# Patient Record
Sex: Female | Born: 1945 | Race: Black or African American | Hispanic: Yes | Marital: Single | Smoking: Current every day smoker
Health system: Southern US, Community
[De-identification: ages and names within clinical notes are randomized; demographics above are authoritative.]

## PROBLEM LIST (undated history)

## (undated) DIAGNOSIS — I10 Essential (primary) hypertension: Secondary | ICD-10-CM

## (undated) HISTORY — PX: BACK SURGERY: SHX140

## (undated) HISTORY — PX: COLONOSCOPY: SHX174

---

## 2017-07-12 ENCOUNTER — Other Ambulatory Visit (HOSPITAL_COMMUNITY): Payer: Self-pay | Admitting: Internal Medicine

## 2017-07-12 DIAGNOSIS — Z1231 Encounter for screening mammogram for malignant neoplasm of breast: Secondary | ICD-10-CM

## 2017-07-27 ENCOUNTER — Encounter (HOSPITAL_COMMUNITY): Payer: Self-pay | Admitting: Radiology

## 2017-07-27 ENCOUNTER — Ambulatory Visit (HOSPITAL_COMMUNITY)
Admission: RE | Admit: 2017-07-27 | Discharge: 2017-07-27 | Disposition: A | Discharge status: Home / Self Care | Payer: Federal, State, Local not specified - PPO | Source: Ambulatory Visit | Attending: Internal Medicine | Admitting: Internal Medicine

## 2017-07-27 DIAGNOSIS — Z1231 Encounter for screening mammogram for malignant neoplasm of breast: Secondary | ICD-10-CM | POA: Insufficient documentation

## 2017-08-24 ENCOUNTER — Ambulatory Visit: Payer: Federal, State, Local not specified - PPO

## 2017-09-07 ENCOUNTER — Ambulatory Visit: Payer: Federal, State, Local not specified - PPO

## 2017-10-17 ENCOUNTER — Ambulatory Visit: Payer: Federal, State, Local not specified - PPO

## 2017-11-08 ENCOUNTER — Ambulatory Visit (INDEPENDENT_AMBULATORY_CARE_PROVIDER_SITE_OTHER): Payer: Self-pay

## 2017-11-08 DIAGNOSIS — Z1211 Encounter for screening for malignant neoplasm of colon: Secondary | ICD-10-CM

## 2017-11-08 MED ORDER — NA SULFATE-K SULFATE-MG SULF 17.5-3.13-1.6 GM/177ML PO SOLN: 1.0000 | ORAL | 0 refills | Status: DC

## 2017-11-08 NOTE — Patient Instructions (Addendum)
Darlene Coleman  03-19-1945 MRN: [id number]     Procedure Date: 03/06/18 Time to register: 8:30am Place to register: [facility] Procedure Time: 9:30am Scheduled provider: Barney Drain, MD    PREPARATION FOR COLONOSCOPY WITH SUPREP BOWEL PREP KIT  Note: Suprep Bowel Prep Kit is a split-dose (2day) regimen. Consumption of BOTH 6-ounce bottles is required for a complete prep.  Please notify us immediately if you are diabetic, take iron supplements, or if you are on Coumadin or any other blood thinners                                                                                                                                                 1 DAY BEFORE PROCEDURE:  DATE: 03/05/18   DAY: Sunday  clear liquids the entire day - NO SOLID FOOD.    At 6:00pm: Complete steps 1 through 4 below, using ONE (1) 6-ounce bottle, before going to bed. Step 1:  Pour ONE (1) 6-ounce bottle of SUPREP liquid into the mixing container.  Step 2:  Add cool drinking water to the 16 ounce line on the container and mix.  Note: Dilute the solution concentrate as directed prior to use. Step 3:  DRINK ALL the liquid in the container. Step 4:  You MUST drink an additional two (2) or more 16 ounce containers of water over the next one (1) hour.   Continue clear liquids.  DAY OF PROCEDURE:   DATE: 03/06/18   DAY: Monday If you take medications for your heart, blood pressure, or breathing, you may take these medications.   5 hours before your procedure at :4:30am Step 1:  Pour ONE (1) 6-ounce bottle of SUPREP liquid into the mixing container.  Step 2:  Add cool drinking water to the 16 ounce line on the container and mix.  Note: Dilute the solution concentrate as directed prior to use. Step 3:  DRINK ALL the liquid in the container. Step 4:  You MUST drink an additional two (2) or more 16 ounce containers of water over the next one (1) hour. You MUST complete the final glass of water at least 3  hours before your colonoscopy.   Nothing by mouth past 6:30am  You may take your morning medications with sip of water unless we have instructed otherwise.    Please see below for Dietary Information.  CLEAR LIQUIDS INCLUDE:  Water Jello (NOT red in color)   Ice Popsicles (NOT red in color)   Tea (sugar ok, no milk/cream) Powdered fruit flavored drinks  Coffee (sugar ok, no milk/cream) Gatorade/ Lemonade/ Kool-Aid  (NOT red in color)   Juice: apple, white grape, white cranberry Soft drinks  Clear bullion, consomme, broth (fat free beef/chicken/vegetable)  Carbonated beverages (any kind)  Strained chicken noodle soup Hard Candy   Remember: Clear liquids are liquids that will  allow you to see your fingers on the other side of a clear glass. Be sure liquids are NOT red in color, and not cloudy, but CLEAR.  DO NOT EAT OR DRINK ANY OF THE FOLLOWING:  Dairy products of any kind   Cranberry juice Tomato juice / V8 juice   Grapefruit juice Orange juice     Red grape juice  Do not eat any solid foods, including such foods as: cereal, oatmeal, yogurt, fruits, vegetables, creamed soups, eggs, bread, crackers, pureed foods in a blender, etc.   HELPFUL HINTS FOR DRINKING PREP SOLUTION:   Make sure prep is extremely cold. Mix and refrigerate the the morning of the prep. You may also put in the freezer.   You may try mixing some Crystal Light or Country Time Lemonade if you prefer. Mix in small amounts; add more if necessary.  Try drinking through a straw  Rinse mouth with water or a mouthwash between glasses, to remove after-taste.  Try sipping on a cold beverage /ice/ popsicles between glasses of prep.  Place a piece of sugar-free hard candy in mouth between glasses.  If you become nauseated, try consuming smaller amounts, or stretch out the time between glasses. Stop for 30-60 minutes, then slowly start back drinking.     OTHER INSTRUCTIONS  You will need a responsible adult at  least 71 years of age to accompany you and drive you home. This person must remain in the waiting room during your procedure. The hospital will cancel your procedure if you do not have a responsible adult with you.   1. Wear loose fitting clothing that is easily removed. 2. Leave jewelry and other valuables at home.  3. Remove all body piercing jewelry and leave at home. 4. Total time from sign-in until discharge is approximately 2-3 hours. 5. You should go home directly after your procedure and rest. You can resume normal activities the day after your procedure. 6. The day of your procedure you should not:  Drive  Make legal decisions  Operate machinery  Drink alcohol  Return to work   You may call the office (Dept: [phone]) before 5:00pm, or page the doctor on call [phone]) after 5:00pm, for further instructions, if necessary.   Insurance Information YOU WILL NEED TO CHECK WITH YOUR INSURANCE COMPANY FOR THE BENEFITS OF COVERAGE YOU HAVE FOR THIS PROCEDURE.  UNFORTUNATELY, NOT ALL INSURANCE COMPANIES HAVE BENEFITS TO COVER ALL OR PART OF THESE TYPES OF PROCEDURES.  IT IS YOUR RESPONSIBILITY TO CHECK YOUR BENEFITS, HOWEVER, WE WILL BE GLAD TO ASSIST YOU WITH ANY CODES YOUR INSURANCE COMPANY MAY NEED.    PLEASE NOTE THAT MOST INSURANCE COMPANIES WILL NOT COVER A SCREENING COLONOSCOPY FOR PEOPLE UNDER THE AGE OF 50  IF YOU HAVE BCBS INSURANCE, YOU MAY HAVE BENEFITS FOR A SCREENING COLONOSCOPY BUT IF POLYPS ARE FOUND THE DIAGNOSIS WILL CHANGE AND THEN YOU MAY HAVE A DEDUCTIBLE THAT WILL NEED TO BE MET. SO PLEASE MAKE SURE YOU CHECK YOUR BENEFITS FOR A SCREENING COLONOSCOPY AS WELL AS A DIAGNOSTIC COLONOSCOPY.

## 2017-11-08 NOTE — Progress Notes (Signed)
Gastroenterology Pre-Procedure Review  Request Date:11/08/17 Requesting Physician: Dr.Fanta- previous tcs 7-8 years ago at [facility] in [place]- pt reports 1 polyp unsure of path. Manuela Schwartz is going to try to get records.   PATIENT REVIEW QUESTIONS: The patient responded to the following health history questions as indicated:    1. Diabetes Melitis: no 2. Joint replacements in the past 12 months: no 3. Major health problems in the past 3 months: no 4. Has an artificial valve or MVP: no 5. Has a defibrillator: no 6. Has been advised in past to take antibiotics in advance of a procedure like teeth cleaning: no 7. Family history of colon cancer: no  8. Alcohol Use: yes (occasionally ) 9. History of sleep apnea: no  10. History of coronary artery or other vascular stents placed within the last 12 months: no 11. History of any prior anesthesia complications: no   MEDICATIONS & ALLERGIES:    Patient reports the following regarding taking any blood thinners:   Plavix? no Aspirin? no Coumadin? no Brilinta? no Xarelto? no Eliquis? no Pradaxa? no Savaysa? no Effient? no  Patient confirms/reports the following medications:  Current Outpatient Medications  Medication Sig Dispense Refill  . amLODipine-olmesartan (AZOR) 5-40 MG tablet Take 1 tablet by mouth daily.    Marland Kitchen ibuprofen (ADVIL,MOTRIN) 200 MG tablet Take 200 mg by mouth every 6 (six) hours as needed.    . Multiple Vitamin (MULTIVITAMIN) tablet Take 1 tablet by mouth daily.     No current facility-administered medications for this visit.     Patient confirms/reports the following allergies:  No Known Allergies  No orders of the defined types were placed in this encounter.   AUTHORIZATION INFORMATION Primary Insurance: Roseanna Rainbow  ID #: [id number] Pre-Cert / Josem Kaufmann required:  Pre-Cert / Josem Kaufmann #:   SCHEDULE INFORMATION: Procedure has been scheduled as follows:  Date: 01/13/18, Time: 10:30 Location: [facility]  Dr.Fields  This Gastroenterology Pre-Precedure Review Form is being routed to the following provider(s): Walden Field NP

## 2017-11-11 NOTE — Progress Notes (Signed)
Ok to schedule.

## 2017-12-29 NOTE — Progress Notes (Signed)
Pt called- her daughter who lives out of town has to have chemo the same week as the pts tcs. Pt wanted to reschedule. We have changed her tcs to 03/06/18 and I have mailed her new instructions with correct dates and times. Endo is aware.

## 2018-03-06 ENCOUNTER — Encounter (HOSPITAL_COMMUNITY): Payer: Self-pay | Admitting: *Deleted

## 2018-03-06 ENCOUNTER — Encounter (HOSPITAL_COMMUNITY)
Admission: RE | Disposition: A | Discharge status: Home / Self Care | Payer: Self-pay | Source: Ambulatory Visit | Attending: Gastroenterology

## 2018-03-06 ENCOUNTER — Ambulatory Visit (HOSPITAL_COMMUNITY)
Admission: RE | Admit: 2018-03-06 | Discharge: 2018-03-06 | Disposition: A | Discharge status: Home / Self Care | Payer: Federal, State, Local not specified - PPO | Source: Ambulatory Visit | Attending: Gastroenterology | Admitting: Gastroenterology

## 2018-03-06 ENCOUNTER — Other Ambulatory Visit: Payer: Self-pay

## 2018-03-06 DIAGNOSIS — Z79899 Other long term (current) drug therapy: Secondary | ICD-10-CM | POA: Diagnosis not present

## 2018-03-06 DIAGNOSIS — Q438 Other specified congenital malformations of intestine: Secondary | ICD-10-CM | POA: Insufficient documentation

## 2018-03-06 DIAGNOSIS — K5730 Diverticulosis of large intestine without perforation or abscess without bleeding: Secondary | ICD-10-CM | POA: Diagnosis not present

## 2018-03-06 DIAGNOSIS — D125 Benign neoplasm of sigmoid colon: Secondary | ICD-10-CM | POA: Insufficient documentation

## 2018-03-06 DIAGNOSIS — K621 Rectal polyp: Secondary | ICD-10-CM | POA: Diagnosis not present

## 2018-03-06 DIAGNOSIS — F17210 Nicotine dependence, cigarettes, uncomplicated: Secondary | ICD-10-CM | POA: Diagnosis not present

## 2018-03-06 DIAGNOSIS — K648 Other hemorrhoids: Secondary | ICD-10-CM | POA: Insufficient documentation

## 2018-03-06 DIAGNOSIS — D122 Benign neoplasm of ascending colon: Secondary | ICD-10-CM

## 2018-03-06 DIAGNOSIS — Z1211 Encounter for screening for malignant neoplasm of colon: Secondary | ICD-10-CM

## 2018-03-06 DIAGNOSIS — D123 Benign neoplasm of transverse colon: Secondary | ICD-10-CM | POA: Insufficient documentation

## 2018-03-06 DIAGNOSIS — I10 Essential (primary) hypertension: Secondary | ICD-10-CM | POA: Insufficient documentation

## 2018-03-06 HISTORY — PX: POLYPECTOMY: SHX5525

## 2018-03-06 HISTORY — PX: COLONOSCOPY: SHX5424

## 2018-03-06 HISTORY — DX: Essential (primary) hypertension: I10

## 2018-03-06 SURGERY — COLONOSCOPY
Anesthesia: Moderate Sedation

## 2018-03-06 MED ORDER — MEPERIDINE HCL 100 MG/ML IJ SOLN
INTRAMUSCULAR | Status: AC
  Filled 2018-03-06: qty 2

## 2018-03-06 MED ORDER — MIDAZOLAM HCL 5 MG/5ML IJ SOLN
INTRAMUSCULAR | Status: DC | PRN
  Administered 2018-03-06: 2 mg via INTRAVENOUS
  Administered 2018-03-06: 1 mg via INTRAVENOUS
  Administered 2018-03-06: 2 mg via INTRAVENOUS

## 2018-03-06 MED ORDER — MIDAZOLAM HCL 5 MG/5ML IJ SOLN
INTRAMUSCULAR | Status: AC
  Filled 2018-03-06: qty 10

## 2018-03-06 MED ORDER — STERILE WATER FOR IRRIGATION IR SOLN
Status: DC | PRN
  Administered 2018-03-06: 10:00:00

## 2018-03-06 MED ORDER — MEPERIDINE HCL 100 MG/ML IJ SOLN
INTRAMUSCULAR | Status: DC | PRN
  Administered 2018-03-06 (×3): 25 mg

## 2018-03-06 MED ORDER — SODIUM CHLORIDE 0.9 % IV SOLN
INTRAVENOUS | Status: DC
  Administered 2018-03-06: 09:00:00 via INTRAVENOUS

## 2018-03-06 NOTE — H&P (Addendum)
Primary Care Physician:  Rosita Fire, MD Primary Gastroenterologist:  Dr. Oneida Alar  Pre-Procedure History & Physical: HPI:  Darlene Coleman is a 73 y.o. female here for  [place]..  Past Medical History:  Diagnosis Date  . Hypertension     Past Surgical History:  Procedure Laterality Date  . BACK SURGERY    . COLONOSCOPY      Prior to Admission medications   Medication Sig Start Date End Date Taking? Authorizing Provider  amLODipine-olmesartan (AZOR) 5-40 MG tablet Take 1 tablet by mouth daily.   Yes [provider]  hydrochlorothiazide (MICROZIDE) 12.5 MG capsule Take 12.5 mg by mouth daily.   Yes [provider]  Multiple Vitamin (MULTIVITAMIN) tablet Take 1 tablet by mouth daily.   Yes [provider]  Na Sulfate-K Sulfate-Mg Sulf (SUPREP BOWEL PREP KIT) 17.5-3.13-1.6 GM/177ML SOLN Take 1 kit by mouth as directed. 11/08/17  Yes Carlis Stable, NP  naproxen sodium (ALEVE) 220 MG tablet Take 440 mg by mouth daily as needed (for back pain).    [provider]    Allergies as of 11/08/2017  . (No Known Allergies)    Family History  Problem Relation Age of Onset  . Colon cancer Neg Hx     Social History   Socioeconomic History  . Marital status: Single    Spouse name: Not on file  . Number of children: Not on file  . Years of education: Not on file  . Highest education level: Not on file  Occupational History  . Not on file  Social Needs  . Financial resource strain: Not on file  . Food insecurity:    Worry: Not on file    Inability: Not on file  . Transportation needs:    Medical: Not on file    Non-medical: Not on file  Tobacco Use  . Smoking status: Current Every Day Smoker    Packs/day: 0.50    Types: Cigarettes  . Smokeless tobacco: Never Used  Substance and Sexual Activity  . Alcohol use: Yes    Comment: 1/2 pint per week  . Drug use: Not Currently  . Sexual activity: Not on file  Lifestyle  .  Physical activity:    Days per week: Not on file    Minutes per session: Not on file  . Stress: Not on file  Relationships  . Social connections:    Talks on phone: Not on file    Gets together: Not on file    Attends religious service: Not on file    Active member of club or organization: Not on file    Attends meetings of clubs or organizations: Not on file    Relationship status: Not on file  . Intimate partner violence:    Fear of current or ex partner: Not on file    Emotionally abused: Not on file    Physically abused: Not on file    Forced sexual activity: Not on file  Other Topics Concern  . Not on file  Social History Narrative  . Not on file    Review of Systems: See HPI, otherwise negative ROS   Physical Exam: BP 140/67   Pulse 77   Temp 98.9 F (37.2 C)   Resp 15   Ht 5' 8.5" (1.74 m)   Wt 107 kg   SpO2 98%   BMI 35.36 kg/m  General:   Alert,  pleasant and cooperative in NAD Head:  Normocephalic and atraumatic. Neck:  Supple; Lungs:  Clear throughout to auscultation.    Heart:  Regular rate and rhythm. Abdomen:  Soft, nontender and nondistended. Normal bowel sounds, without guarding, and without rebound.   Neurologic:  Alert and  oriented x4;  grossly normal neurologically.  Impression/Plan:     COLON CANCER SCREENING.  PLAN: 1. TCS TODAY. DISCUSSED PROCEDURE, BENEFITS, & RISKS: < 1% chance of medication reaction, bleeding, perforation, or rupture of spleen/liver.

## 2018-03-06 NOTE — Op Note (Signed)
[facility] Patient Name: Darlene Coleman Procedure Date: 03/06/2018 8:19 AM MRN: [id number] Date of Birth: 1945-04-03 Attending MD: Barney Drain MD, MD CSN: [id number] Age: 73 Admit Type: Outpatient Procedure:                Colonoscopy WITH COLD SNARE POLYPECTOMY Indications:              Screening for colorectal malignant neoplasm Providers:                Barney Drain MD, MD, Janeece Riggers, RN, Tammy Vaught,                            RN, Gerome Sam, RN Referring MD:             Rosita Fire MD, MD Medicines:                Meperidine 75 mg IV, Midazolam 5 mg IV Complications:            No immediate complications. Estimated Blood Loss:     Estimated blood loss was minimal. Procedure:                Pre-Anesthesia Assessment:                           - Prior to the procedure, a History and Physical                            was performed, and patient medications and                            allergies were reviewed. The patient's tolerance of                            previous anesthesia was also reviewed. The risks                            and benefits of the procedure and the sedation                            options and risks were discussed with the patient.                            All questions were answered, and informed consent                            was obtained. Prior Anticoagulants: The patient has                            taken no previous anticoagulant or antiplatelet                            agents. ASA Grade Assessment: II - A patient with                            mild systemic disease. After reviewing the risks  and benefits, the patient was deemed in                            satisfactory condition to undergo the procedure.                            After obtaining informed consent, the colonoscope                            was passed under direct vision. Throughout the   procedure, the patient's blood pressure, pulse, and                            oxygen saturations were monitored continuously. The                            [id number]-[id number] ([id number]) scope was introduced through                            the anus and advanced to the the cecum, identified                            by appendiceal orifice and ileocecal valve. The                            colonoscopy was somewhat difficult due to a                            tortuous colon. Successful completion of the                            procedure was aided by straightening and shortening                            the scope to obtain bowel loop reduction and                            COLOWRAP. The patient tolerated the procedure well.                            The quality of the bowel preparation was good. The                            ileocecal valve, appendiceal orifice, and rectum                            were photographed. Scope In: 9:52:49 AM Scope Out: 10:15:51 AM Scope Withdrawal Time: 0 hours 21 minutes 15 seconds  Total Procedure Duration: 0 hours 23 minutes 2 seconds  Findings:      Seven sessile polyps were found in the rectum(3), sigmoid colon,       transverse colon and ascending colon(2). The polyps were 3 to 7 mm in       size. These polyps were removed with a cold snare. Resection and  retrieval were complete.      Multiple small and large-mouthed diverticula were found in the entire       colon.      Internal hemorrhoids were found. The hemorrhoids were small.      The recto-sigmoid colon and sigmoid colon were mildly redundant. Impression:               - Seven 3 to 7 mm polyps in the rectum, in the                            sigmoid colon, in the transverse colon and in the                            ascending colon, removed with a cold snare.                            Resected and retrieved.                           - MODERATE Diverticulosis in the entire examined                             colon.                           - Internal hemorrhoids.                           - Redundant colon. Moderate Sedation:      Moderate (conscious) sedation was administered by the endoscopy nurse       and supervised by the endoscopist. The following parameters were       monitored: oxygen saturation, heart rate, blood pressure, and response       to care. Total physician intraservice time was 38 minutes. Recommendation:           - Patient has a contact number available for                            emergencies. The signs and symptoms of potential                            delayed complications were discussed with the                            patient. Return to normal activities tomorrow.                            Written discharge instructions were provided to the                            patient.                           - High fiber diet. LOSE WEIGHT TO BMI < 30                           -  Continue present medications.                           - Await pathology results.                           - Repeat colonoscopy in 3 years for surveillance. Procedure Code(s):        --- Professional ---                           [phone], Colonoscopy, flexible; with removal of                            tumor(s), polyp(s), or other lesion(s) by snare                            technique                           99153, Moderate sedation; each additional 15                            minutes intraservice time                           99153, Moderate sedation; each additional 15                            minutes intraservice time                           G0500, Moderate sedation services provided by the                            same physician or other qualified health care                            professional performing a gastrointestinal                            endoscopic service that sedation supports,                            requiring the presence of  an independent trained                            observer to assist in the monitoring of the                            patient's level of consciousness and physiological                            status; initial 15 minutes of intra-service time;                            patient age 63 years or older (additional time 89  be reported with [phone], as appropriate) Diagnosis Code(s):        --- Professional ---                           Z12.11, Encounter for screening for malignant                            neoplasm of colon                           K62.1, Rectal polyp                           D12.5, Benign neoplasm of sigmoid colon                           D12.3, Benign neoplasm of transverse colon (hepatic                            flexure or splenic flexure)                           D12.2, Benign neoplasm of ascending colon                           K64.8, Other hemorrhoids                           K57.30, Diverticulosis of large intestine without                            perforation or abscess without bleeding                           Q43.8, Other specified congenital malformations of                            intestine CPT copyright 2018 [employer] Association. All rights reserved. The codes documented in this report are preliminary and upon coder review may  be revised to meet current compliance requirements. Barney Drain, MD Barney Drain MD, MD 03/06/2018 10:49:23 AM This report has been signed electronically. Number of Addenda: 0

## 2018-03-06 NOTE — Discharge Instructions (Signed)
You have small internal hemorrhoids and diverticulosis IN YOUR LEFT AND RIGHT COLON. YOU HAD SEVEN POLYPS REMOVED.    DRINK WATER TO KEEP YOUR URINE LIGHT YELLOW.  CONTINUE YOUR WEIGHT LOSS EFFORTS. YOUR BODY MASS INDEX IS OVER 30 WHICH MEANS YOU ARE OBESE. OBESITY IS ASSOCIATED WITH AN INCREASED FOR CIRRHOSIS AND ALL CANCERS, INCLUDING ESOPHAGEAL AND COLON CANCER. A WEIGHT OF 195 LBS OR LESS  WILL GET YOUR BODY MASS INDEX(BMI) UNDER 30.  FOLLOW A HIGH FIBER DIET. AVOID ITEMS THAT CAUSE BLOATING. See info below.  YOUR BIOPSY RESULTS WILL BE BACK IN 5 BUSINESS DAYS.  USE PREPARATION H FOUR TIMES  A DAY IF NEEDED TO RELIEVE RECTAL PAIN/PRESSURE/BLEEDING.  Next colonoscopy in 3 years.  Colonoscopy Care After Read the instructions outlined below and refer to this sheet in the next week. These discharge instructions provide you with general information on caring for yourself after you leave the hospital. While your treatment has been planned according to the most current medical practices available, unavoidable complications occasionally occur. If you have any problems or questions after discharge, call DR. Hakan Nudelman, [phone].  ACTIVITY  You may resume your regular activity, but move at a slower pace for the next 24 hours.   Take frequent rest periods for the next 24 hours.   Walking will help get rid of the air and reduce the bloated feeling in your belly (abdomen).   No driving for 24 hours (because of the medicine (anesthesia) used during the test).   You may shower.   Do not sign any important legal documents or operate any machinery for 24 hours (because of the anesthesia used during the test).    NUTRITION  Drink plenty of fluids.   You may resume your normal diet as instructed by your doctor.   Begin with a light meal and progress to your normal diet. Heavy or fried foods are harder to digest and may make you feel sick to your stomach (nauseated).   Avoid alcoholic  beverages for 24 hours or as instructed.    MEDICATIONS  You may resume your normal medications.   WHAT YOU CAN EXPECT TODAY  Some feelings of bloating in the abdomen.   Passage of more gas than usual.   Spotting of blood in your stool or on the toilet paper  .  IF YOU HAD POLYPS REMOVED DURING THE COLONOSCOPY:  Eat a soft diet IF YOU HAVE NAUSEA, BLOATING, ABDOMINAL PAIN, OR VOMITING.    FINDING OUT THE RESULTS OF YOUR TEST Not all test results are available during your visit. DR. Oneida Alar WILL CALL YOU WITHIN 14 DAYS OF YOUR PROCEDUE WITH YOUR RESULTS. Do not assume everything is normal if you have not heard from DR. Gregrey Bloyd, CALL HER OFFICE AT [phone].  SEEK IMMEDIATE MEDICAL ATTENTION AND CALL THE OFFICE: [phone] IF:  You have more than a spotting of blood in your stool.   Your belly is swollen (abdominal distention).   You are nauseated or vomiting.   You have a temperature over 101F.   You have abdominal pain or discomfort that is severe or gets worse throughout the day.  High-Fiber Diet A high-fiber diet changes your normal diet to include more whole grains, legumes, fruits, and vegetables. Changes in the diet involve replacing refined carbohydrates with unrefined foods. The calorie level of the diet is essentially unchanged. The Dietary Reference Intake (recommended amount) for adult males is 38 grams per day. For adult females, it is 25 grams per day.  Pregnant and lactating women should consume 28 grams of fiber per day. Fiber is the intact part of a plant that is not broken down during digestion. Functional fiber is fiber that has been isolated from the plant to provide a beneficial effect in the body.  PURPOSE  Increase stool bulk.   Ease and regulate bowel movements.   Lower cholesterol.   REDUCE RISK OF COLON CANCER  INDICATIONS THAT YOU NEED MORE FIBER  Constipation and hemorrhoids.   Uncomplicated diverticulosis (intestine condition) and  irritable bowel syndrome.   Weight management.   As a protective measure against hardening of the arteries (atherosclerosis), diabetes, and cancer.   GUIDELINES FOR INCREASING FIBER IN THE DIET  Start adding fiber to the diet slowly. A gradual increase of about 5 more grams (2 slices of whole-wheat bread, 2 servings of most fruits or vegetables, or 1 bowl of high-fiber cereal) per day is best. Too rapid an increase in fiber may result in constipation, flatulence, and bloating.   Drink enough water and fluids to keep your urine clear or pale yellow. Water, juice, or caffeine-free drinks are recommended. Not drinking enough fluid may cause constipation.   Eat a variety of high-fiber foods rather than one type of fiber.   Try to increase your intake of fiber through using high-fiber foods rather than fiber pills or supplements that contain small amounts of fiber.   The goal is to change the types of food eaten. Do not supplement your present diet with high-fiber foods, but replace foods in your present diet.   INCLUDE A VARIETY OF FIBER SOURCES  Replace refined and processed grains with whole grains, canned fruits with fresh fruits, and incorporate other fiber sources. White rice, white breads, and most bakery goods contain little or no fiber.   Brown whole-grain rice, buckwheat oats, and many fruits and vegetables are all good sources of fiber. These include: broccoli, Brussels sprouts, cabbage, cauliflower, beets, sweet potatoes, white potatoes (skin on), carrots, tomatoes, eggplant, squash, berries, fresh fruits, and dried fruits.   Cereals appear to be the richest source of fiber. Cereal fiber is found in whole grains and bran. Bran is the fiber-rich outer coat of cereal grain, which is largely removed in refining. In whole-grain cereals, the bran remains. In breakfast cereals, the largest amount of fiber is found in those with "bran" in their names. The fiber content is sometimes indicated  on the label.   You may need to include additional fruits and vegetables each day.   In baking, for 1 cup white flour, you may use the following substitutions:   1 cup whole-wheat flour minus 2 tablespoons.   1/2 cup white flour plus 1/2 cup whole-wheat flour.   Polyps, Colon  A polyp is extra tissue that grows inside your body. Colon polyps grow in the large intestine. The large intestine, also called the colon, is part of your digestive system. It is a long, hollow tube at the end of your digestive tract where your body makes and stores stool. Most polyps are not dangerous. They are benign. This means they are not cancerous. But over time, some types of polyps can turn into cancer. Polyps that are smaller than a pea are usually not harmful. But larger polyps could someday become or may already be cancerous. To be safe, doctors remove all polyps and test them.   PREVENTION There is not one sure way to prevent polyps. You might be able to lower your risk of getting them  if you:  Eat more fruits and vegetables and less fatty food.   Do not smoke.   Avoid alcohol.   Exercise every day.   Lose weight if you are overweight.   Eating more calcium and folate can also lower your risk of getting polyps. Some foods that are rich in calcium are milk, cheese, and broccoli. Some foods that are rich in folate are chickpeas, kidney beans, and spinach.    Diverticulosis Diverticulosis is a common condition that develops when small pouches (diverticula) form in the wall of the colon. The risk of diverticulosis increases with age. It happens more often in people who eat a low-fiber diet. Most individuals with diverticulosis have no symptoms. Those individuals with symptoms usually experience belly (abdominal) pain, constipation, or loose stools (diarrhea).  HOME CARE INSTRUCTIONS  Increase the amount of fiber in your diet as directed by your caregiver or dietician. This may reduce symptoms of  diverticulosis.   Drink at least 6 to 8 glasses of water each day to prevent constipation.   Try not to strain when you have a bowel movement.   Avoiding nuts and seeds to prevent complications is NOT NECESSARY.   FOODS HAVING HIGH FIBER CONTENT INCLUDE:  Fruits. Apple, peach, pear, tangerine, raisins, prunes.   Vegetables. Brussels sprouts, asparagus, broccoli, cabbage, carrot, cauliflower, romaine lettuce, spinach, summer squash, tomato, winter squash, zucchini.   Starchy Vegetables. Baked beans, kidney beans, lima beans, split peas, lentils, potatoes (with skin).   Grains. Whole wheat bread, brown rice, bran flake cereal, plain oatmeal, white rice, shredded wheat, bran muffins.   SEEK IMMEDIATE MEDICAL CARE IF:  You develop increasing pain or severe bloating.   You have an oral temperature above 101F.   You develop vomiting or bowel movements that are bloody or black.

## 2018-03-08 ENCOUNTER — Encounter (HOSPITAL_COMMUNITY): Payer: Self-pay | Admitting: Gastroenterology

## 2018-03-08 ENCOUNTER — Telehealth: Payer: Self-pay | Admitting: Gastroenterology

## 2018-03-08 NOTE — Telephone Encounter (Signed)
Pt is aware.  

## 2018-03-08 NOTE — Telephone Encounter (Signed)
Reminder in epic °

## 2018-03-08 NOTE — Telephone Encounter (Signed)
Please call pt. 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   DRINK WATER TO KEEP YOUR URINE LIGHT YELLOW. FOLLOW A HIGH FIBER DIET. AVOID ITEMS THAT CAUSE BLOATING & GAS.  Next colonoscopy in 3 years.

## 2018-06-23 ENCOUNTER — Other Ambulatory Visit (HOSPITAL_COMMUNITY): Payer: Self-pay | Admitting: Internal Medicine

## 2018-06-23 DIAGNOSIS — Z1231 Encounter for screening mammogram for malignant neoplasm of breast: Secondary | ICD-10-CM

## 2018-07-31 ENCOUNTER — Ambulatory Visit (HOSPITAL_COMMUNITY): Payer: Federal, State, Local not specified - PPO

## 2018-09-22 ENCOUNTER — Other Ambulatory Visit: Payer: Self-pay

## 2018-09-22 ENCOUNTER — Ambulatory Visit (HOSPITAL_COMMUNITY)
Admission: RE | Admit: 2018-09-22 | Discharge: 2018-09-22 | Disposition: A | Discharge status: Home / Self Care | Payer: Federal, State, Local not specified - PPO | Source: Ambulatory Visit | Attending: Internal Medicine | Admitting: Internal Medicine

## 2018-09-22 DIAGNOSIS — Z1231 Encounter for screening mammogram for malignant neoplasm of breast: Secondary | ICD-10-CM | POA: Diagnosis not present

## 2020-04-08 IMAGING — MG DIGITAL SCREENING BILATERAL MAMMOGRAM WITH TOMO AND CAD
8 series · 8 of 24 positions shown · non-contrast
Comparison: Previous exam(s).

CLINICAL DATA: Screening.

EXAM:
DIGITAL SCREENING BILATERAL MAMMOGRAM WITH TOMO AND CAD

[R CC synth-2D]
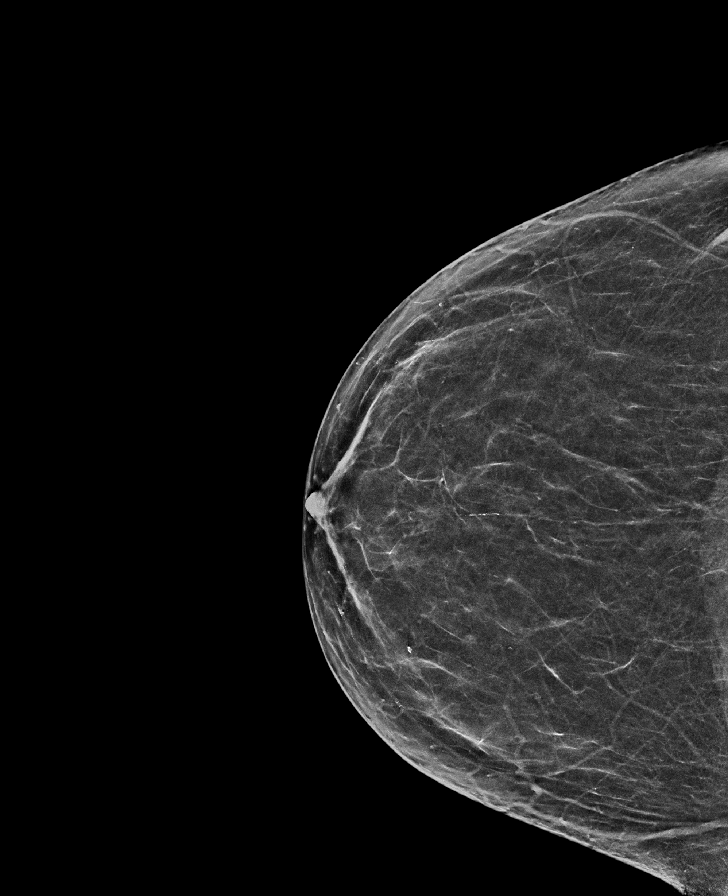

[R MLO synth-2D]
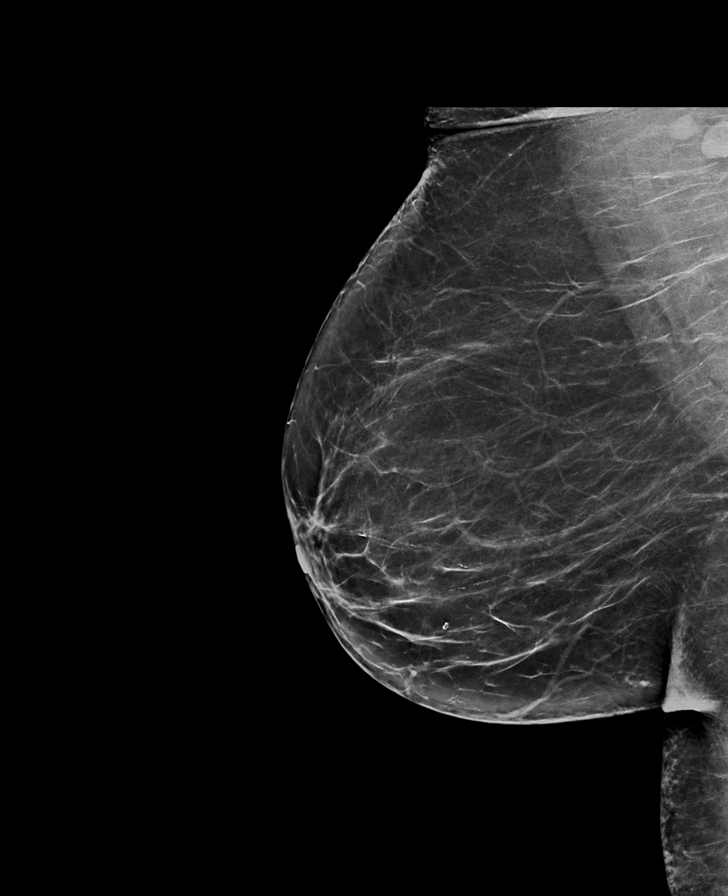

[L CC synth-2D]
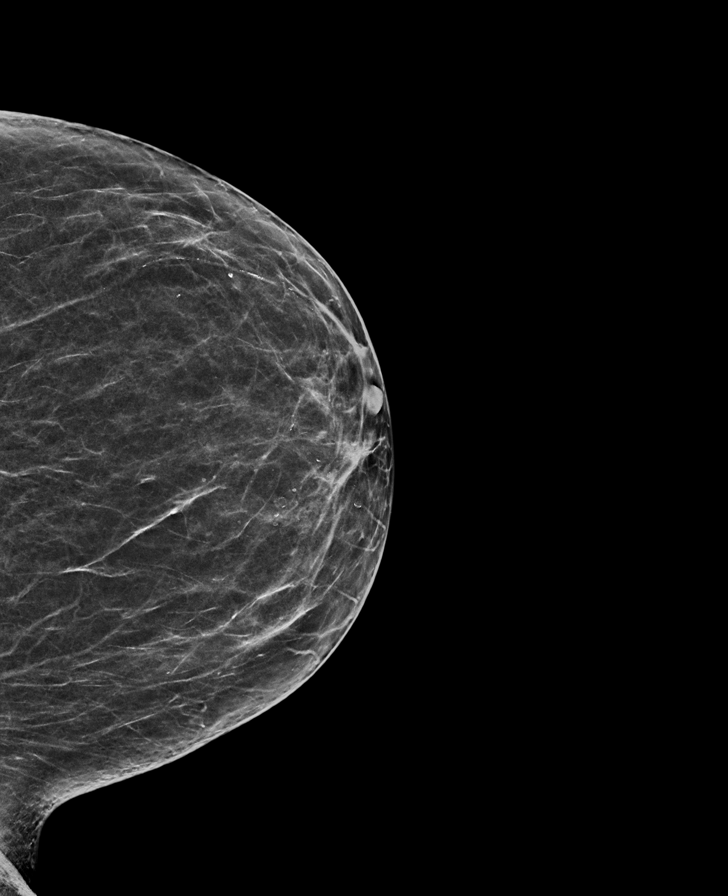

[L MLO synth-2D]
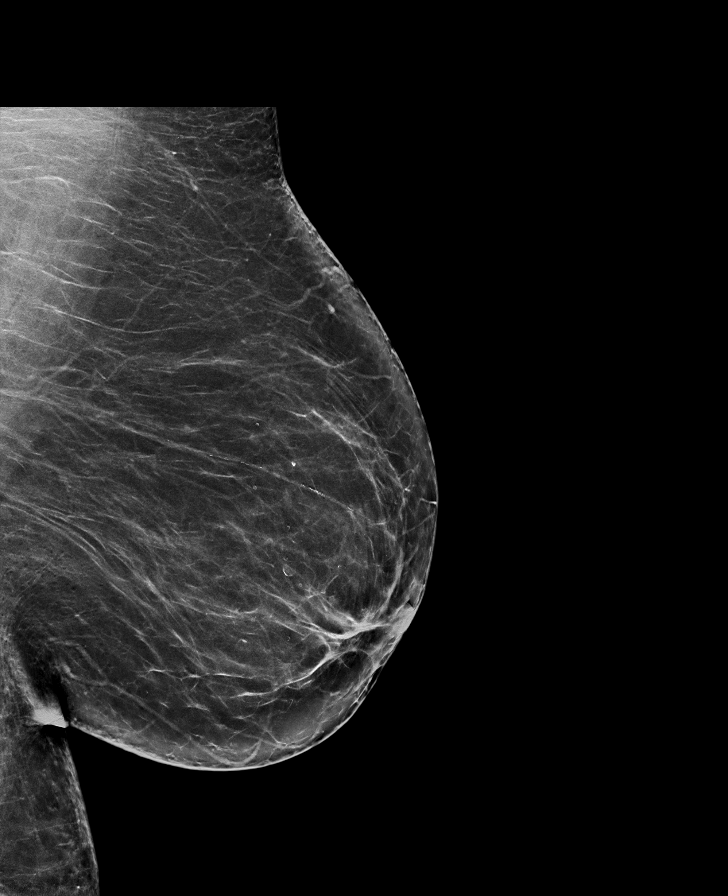

[R CC tomo · tomo slice 31/60.0]
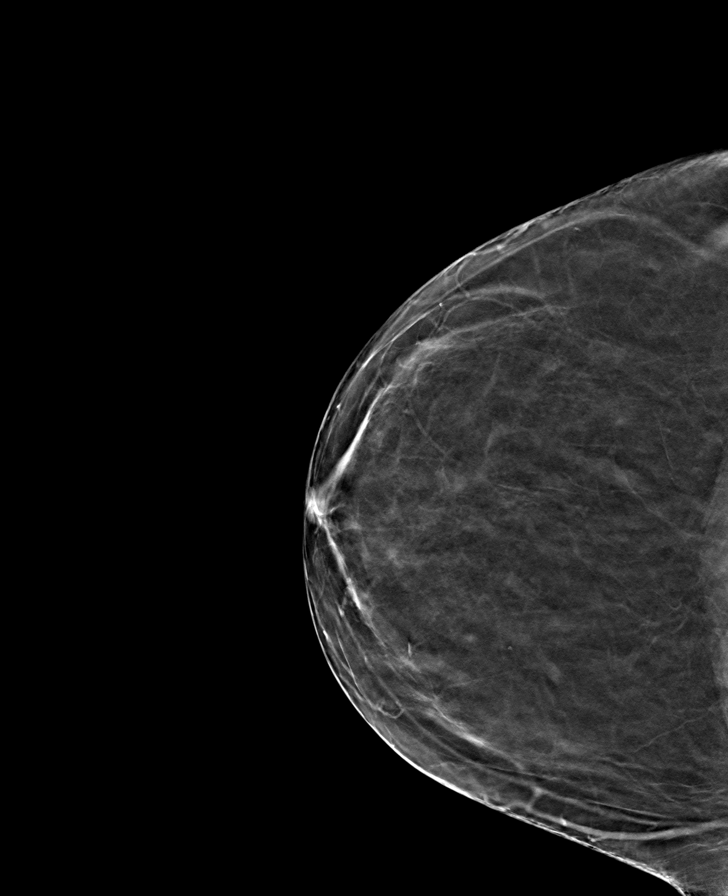

[L MLO tomo · tomo slice 38/75.0]
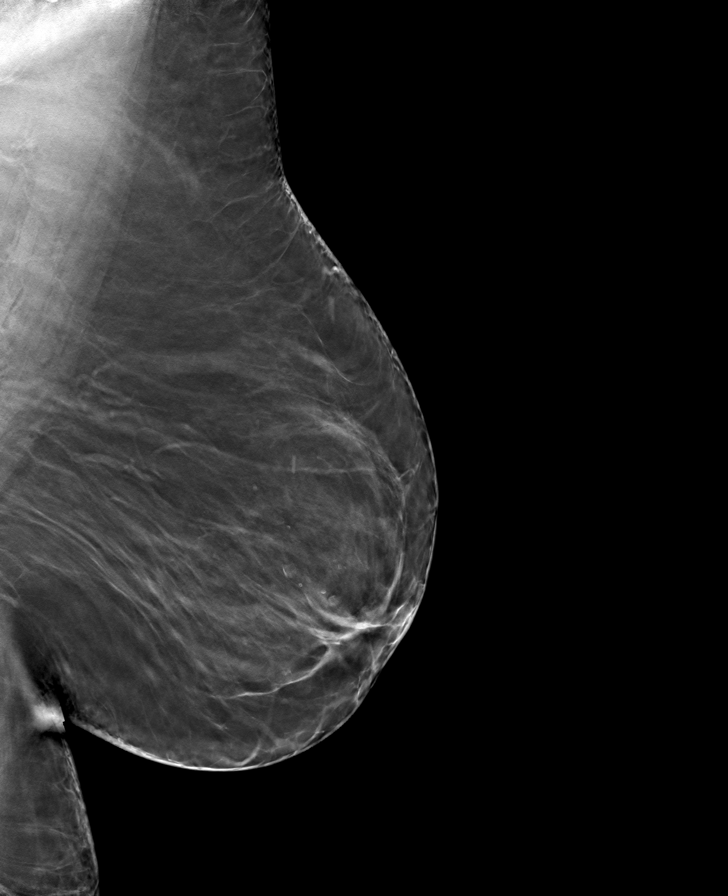

[L CC tomo · tomo slice 30/59.0]
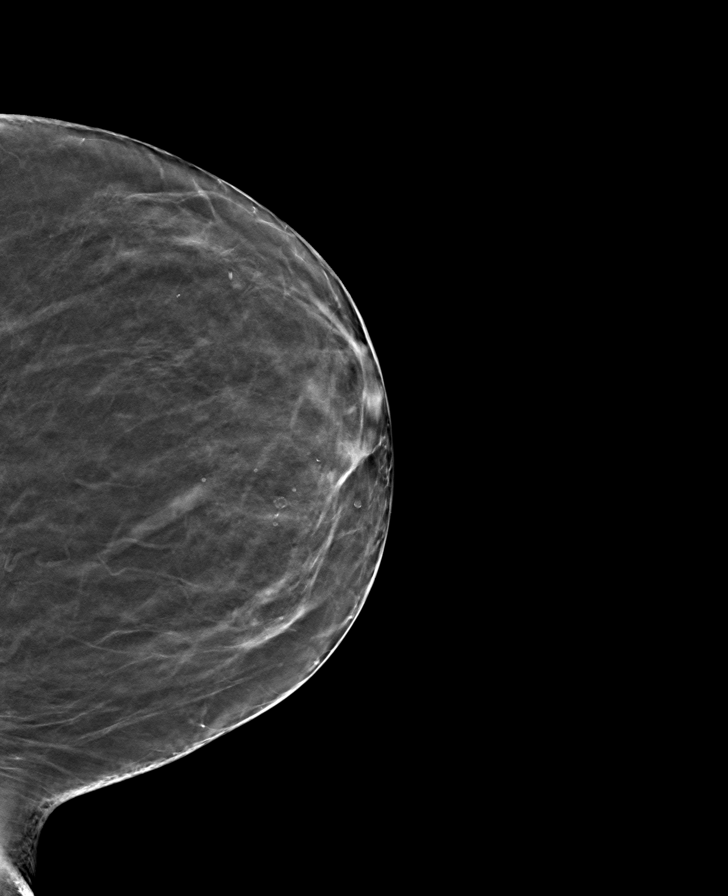

[R MLO tomo · tomo slice 38/75.0]
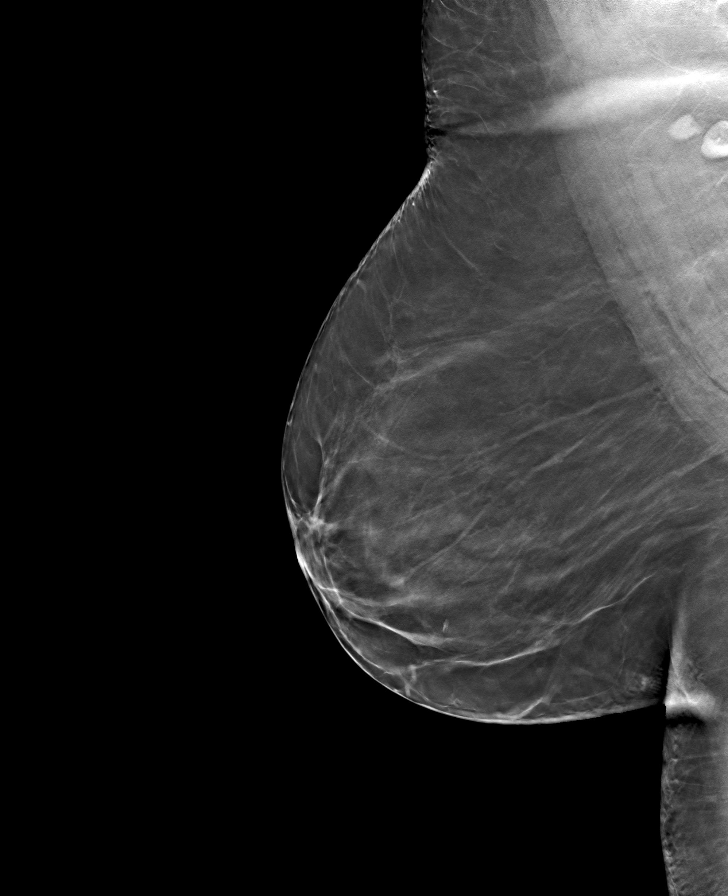

[8 of 24 positions shown; findings below may reference images not displayed]

ACR Breast Density Category b: There are scattered areas of
fibroglandular density.
FINDINGS: There are no findings suspicious for malignancy. Images were
processed with CAD.
IMPRESSION: No mammographic evidence of malignancy. A result letter of this
screening mammogram will be mailed directly to the patient.

RECOMMENDATION:
Screening mammogram in one year. (Code:[id number])

BI-RADS CATEGORY  1: Negative.

## 2021-02-19 ENCOUNTER — Encounter: Payer: Self-pay | Admitting: *Deleted

## 2021-03-16 ENCOUNTER — Encounter: Payer: Self-pay | Admitting: Internal Medicine

## 2021-05-12 ENCOUNTER — Ambulatory Visit: Payer: Self-pay
